# Patient Record
Sex: Male | Born: 1970 | Race: White | Hispanic: No | Marital: Married | Smoking: Current every day smoker
Health system: Southern US, Community
[De-identification: ages and names within clinical notes are randomized; demographics above are authoritative.]

## PROBLEM LIST (undated history)

## (undated) DIAGNOSIS — I10 Essential (primary) hypertension: Secondary | ICD-10-CM

## (undated) DIAGNOSIS — F329 Major depressive disorder, single episode, unspecified: Secondary | ICD-10-CM

## (undated) DIAGNOSIS — F32A Depression, unspecified: Secondary | ICD-10-CM

## (undated) HISTORY — PX: HERNIA REPAIR: SHX51

---

## 2007-02-17 ENCOUNTER — Emergency Department (HOSPITAL_COMMUNITY): Admission: EM | Admit: 2007-02-17 | Discharge: 2007-02-17 | Payer: Self-pay | Admitting: Emergency Medicine

## 2008-02-10 ENCOUNTER — Emergency Department (HOSPITAL_COMMUNITY): Admission: EM | Admit: 2008-02-10 | Discharge: 2008-02-10 | Payer: Self-pay | Admitting: Emergency Medicine

## 2009-05-21 ENCOUNTER — Emergency Department (HOSPITAL_COMMUNITY): Admission: EM | Admit: 2009-05-21 | Discharge: 2009-05-21 | Payer: Self-pay | Admitting: Emergency Medicine

## 2010-05-26 IMAGING — CR DG CHEST 2V
2 series · 2 of 2 positions shown · non-contrast
Comparison: None

CLINICAL DATA: High blood pressure and palpitations

CHEST - 2 VIEW

[view not recorded (1 of 2)]
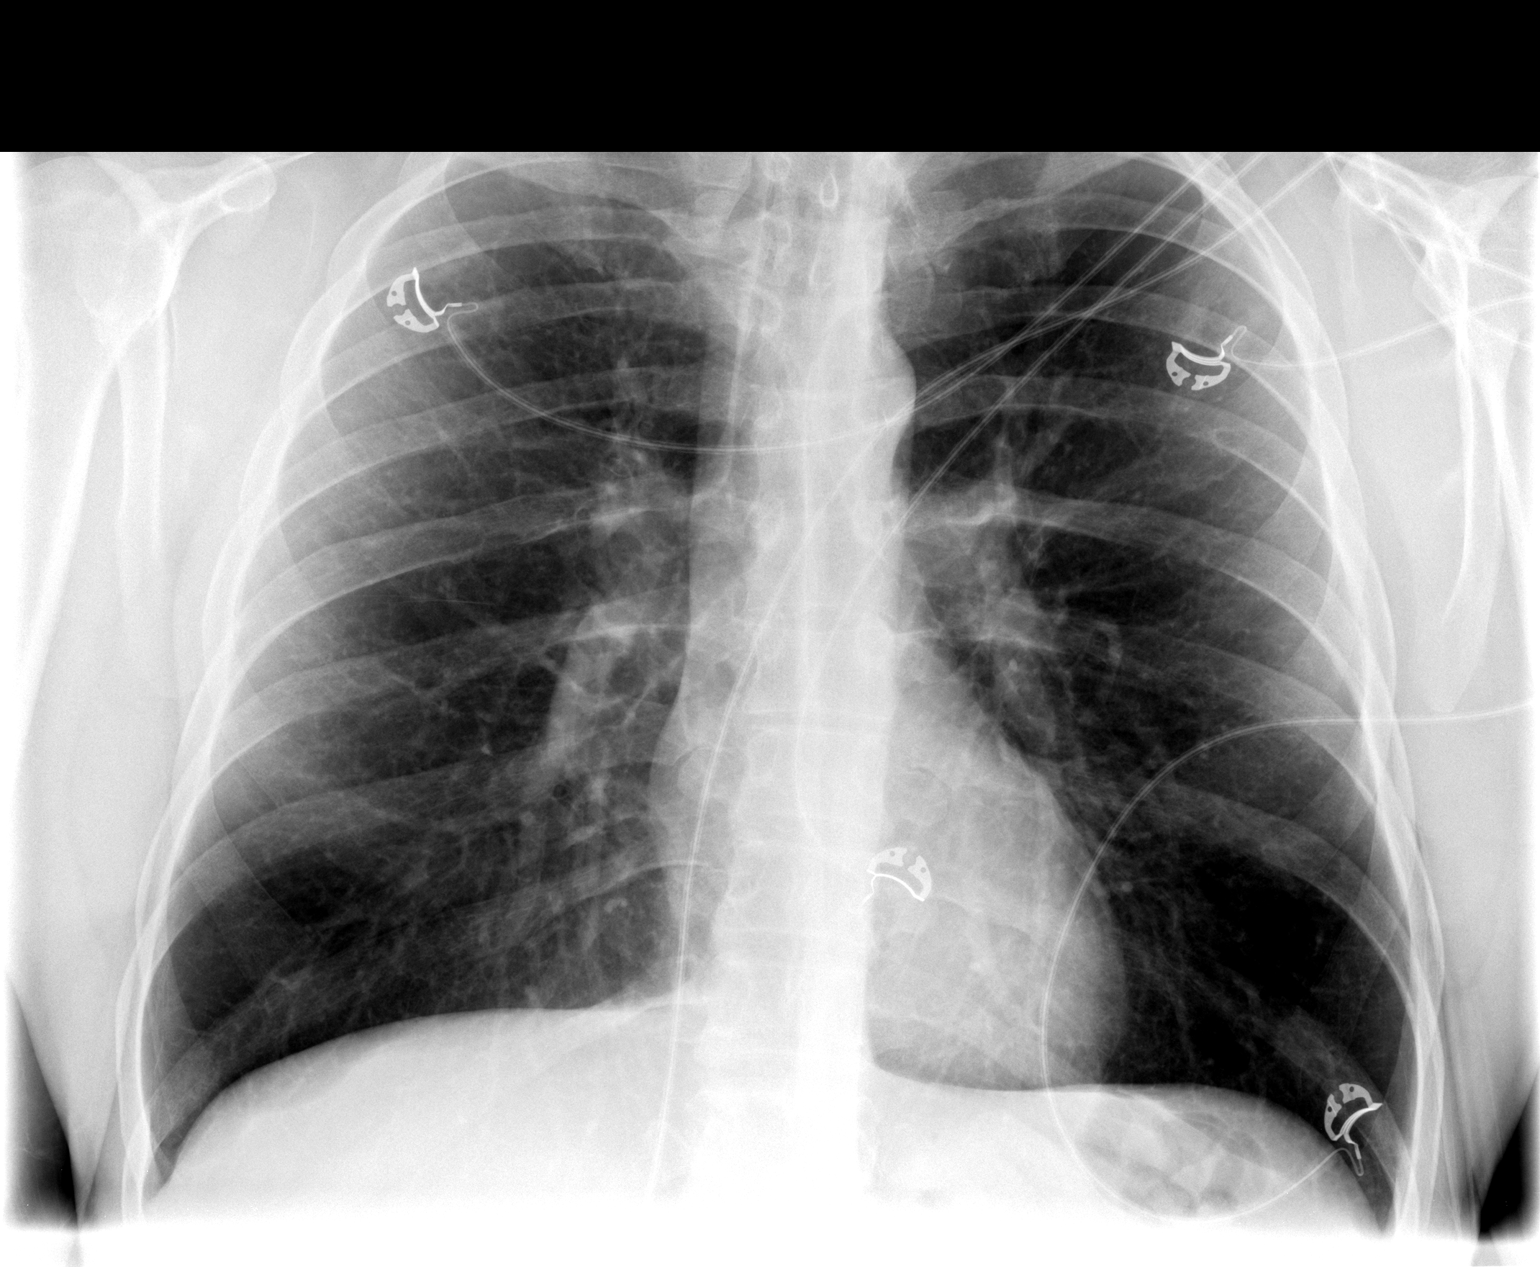

[view not recorded (2 of 2)]
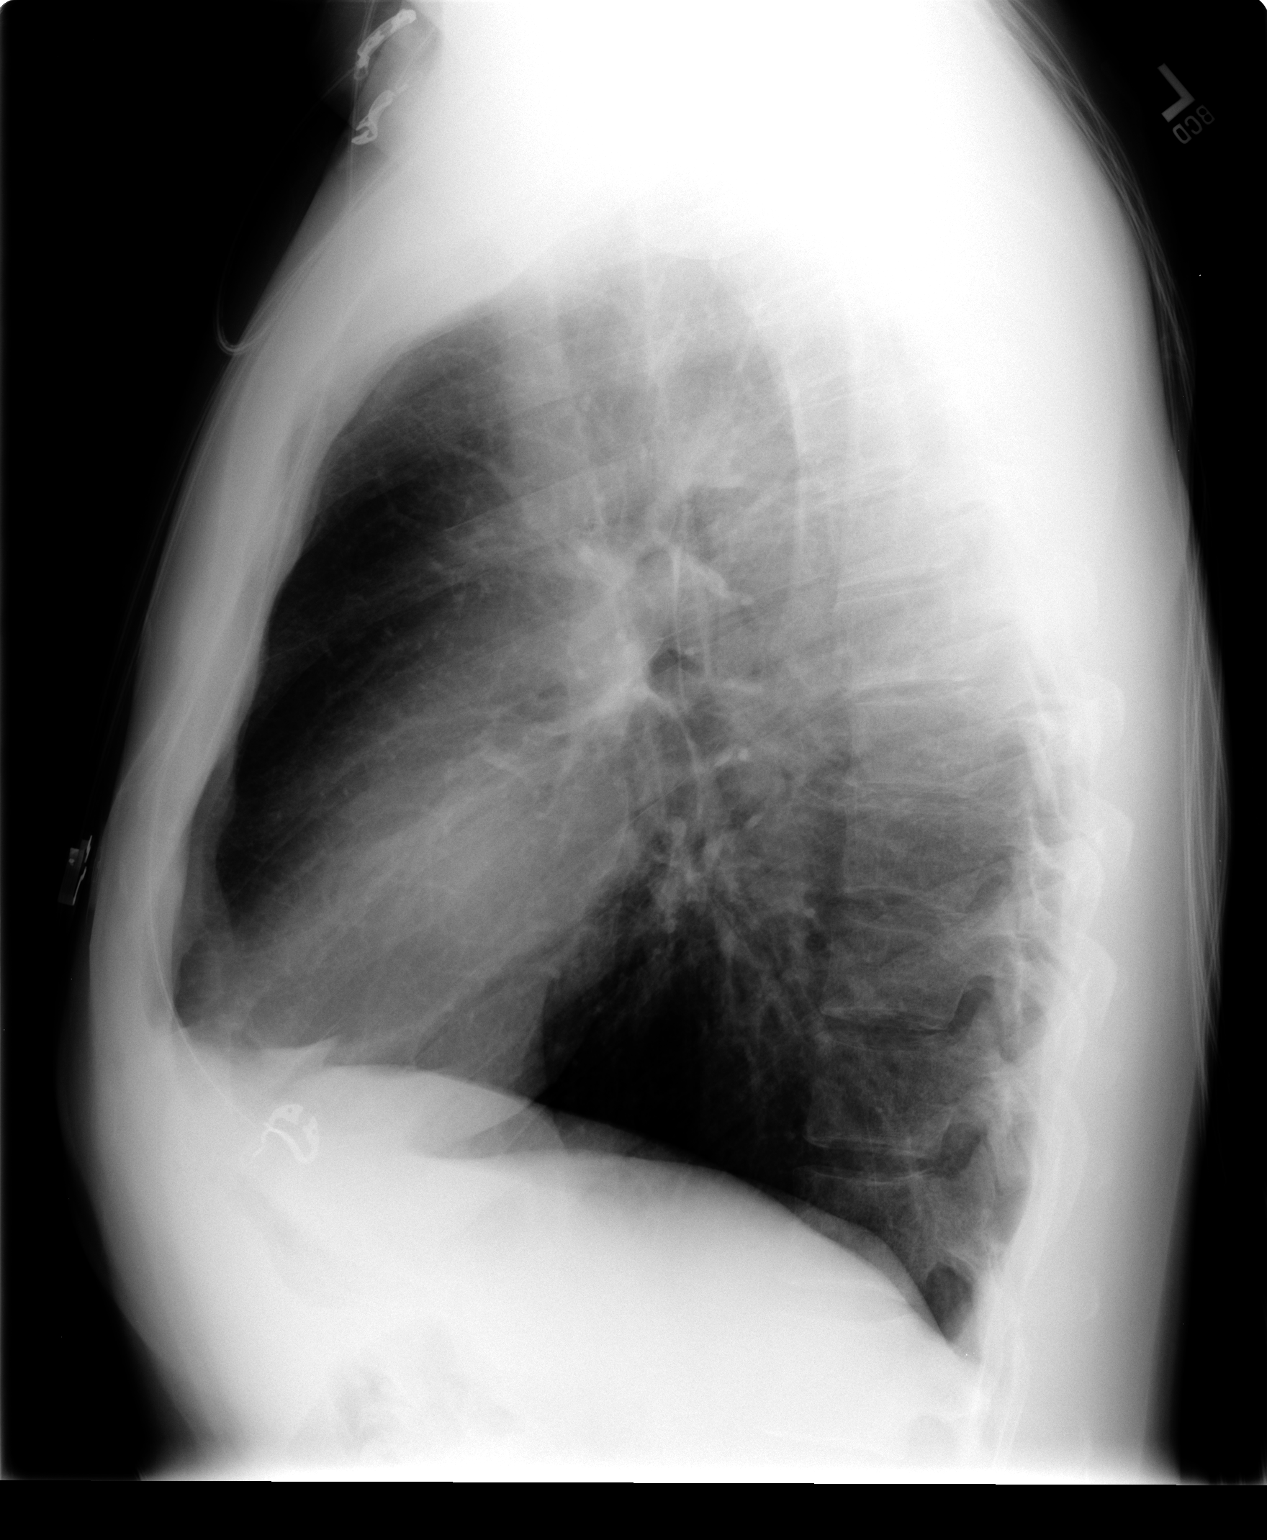

[2 of 2 positions shown; findings below may reference images not displayed]

FINDINGS: Heart and mediastinal contours are normal.  The trachea
is midline.  Lungs are well expanded and clear.  There is no
pleural effusion or pneumothorax.  Pulmonary vascularity is normal.
No acute osseous abnormality identified.
IMPRESSION: No acute cardiopulmonary disease.

## 2010-12-16 LAB — POCT CARDIAC MARKERS
CKMB, poc: <1 ng/mL — ABNORMAL LOW (ref 1.0–8.0)
Myoglobin, poc: 38.7 ng/mL (ref 12–200)
Troponin i, poc: <0.05 ng/mL (ref 0.00–0.09)

## 2010-12-16 LAB — POCT I-STAT, CHEM 8
Chloride: 96 mEq/L (ref 96–112)
Glucose, Bld: 105 mg/dL — ABNORMAL HIGH (ref 70–99)
HCT: 49 % (ref 39.0–52.0)
Hemoglobin: 16.7 g/dL (ref 13.0–17.0)
Potassium: 3.5 mEq/L (ref 3.5–5.1)
Sodium: 130 mEq/L — ABNORMAL LOW (ref 135–145)

## 2016-08-31 ENCOUNTER — Encounter (HOSPITAL_COMMUNITY): Payer: Self-pay | Admitting: Emergency Medicine

## 2016-08-31 ENCOUNTER — Emergency Department (HOSPITAL_COMMUNITY)
Admission: EM | Admit: 2016-08-31 | Discharge: 2016-09-01 | Disposition: A | Discharge status: Other Acute Inpatient Hospital | Payer: Managed Care, Other (non HMO) | Attending: Emergency Medicine | Admitting: Emergency Medicine

## 2016-08-31 DIAGNOSIS — Z79899 Other long term (current) drug therapy: Secondary | ICD-10-CM | POA: Diagnosis not present

## 2016-08-31 DIAGNOSIS — I10 Essential (primary) hypertension: Secondary | ICD-10-CM | POA: Insufficient documentation

## 2016-08-31 DIAGNOSIS — R4182 Altered mental status, unspecified: Secondary | ICD-10-CM

## 2016-08-31 DIAGNOSIS — F29 Unspecified psychosis not due to a substance or known physiological condition: Secondary | ICD-10-CM | POA: Diagnosis not present

## 2016-08-31 DIAGNOSIS — F17210 Nicotine dependence, cigarettes, uncomplicated: Secondary | ICD-10-CM | POA: Insufficient documentation

## 2016-08-31 HISTORY — DX: Major depressive disorder, single episode, unspecified: F32.9

## 2016-08-31 HISTORY — DX: Depression, unspecified: F32.A

## 2016-08-31 HISTORY — DX: Essential (primary) hypertension: I10

## 2016-08-31 LAB — COMPREHENSIVE METABOLIC PANEL
ALT: 61 U/L (ref 17–63)
AST: 103 U/L — ABNORMAL HIGH (ref 15–41)
Albumin: 4.9 g/dL (ref 3.5–5.0)
Alkaline Phosphatase: 62 U/L (ref 38–126)
Anion gap: 15 (ref 5–15)
BILIRUBIN TOTAL: 1.2 mg/dL (ref 0.3–1.2)
BUN: 12 mg/dL (ref 6–20)
CHLORIDE: 94 mmol/L — AB (ref 101–111)
CO2: 23 mmol/L (ref 22–32)
CREATININE: 1.13 mg/dL (ref 0.61–1.24)
Calcium: 9.9 mg/dL (ref 8.9–10.3)
Glucose, Bld: 112 mg/dL — ABNORMAL HIGH (ref 65–99)
Potassium: 3.7 mmol/L (ref 3.5–5.1)
Sodium: 132 mmol/L — ABNORMAL LOW (ref 135–145)
TOTAL PROTEIN: 8 g/dL (ref 6.5–8.1)

## 2016-08-31 LAB — URINALYSIS, ROUTINE W REFLEX MICROSCOPIC
GLUCOSE, UA: NEGATIVE mg/dL
HGB URINE DIPSTICK: NEGATIVE
Ketones, ur: >80 mg/dL — AB
Leukocytes, UA: NEGATIVE
Nitrite: NEGATIVE
PH: 6.5 (ref 5.0–8.0)
Protein, ur: 30 mg/dL — AB
SPECIFIC GRAVITY, URINE: 1.02 (ref 1.005–1.030)

## 2016-08-31 LAB — CBC WITH DIFFERENTIAL/PLATELET
BASOS ABS: 0 10*3/uL (ref 0.0–0.1)
BASOS PCT: 0 %
EOS ABS: 0 10*3/uL (ref 0.0–0.7)
EOS PCT: 0 %
HCT: 41.8 % (ref 39.0–52.0)
Hemoglobin: 14.9 g/dL (ref 13.0–17.0)
LYMPHS PCT: 10 %
Lymphs Abs: 2.3 10*3/uL (ref 0.7–4.0)
MCH: 31.4 pg (ref 26.0–34.0)
MCHC: 35.6 g/dL (ref 30.0–36.0)
MCV: 88.2 fL (ref 78.0–100.0)
MONO ABS: 2.1 10*3/uL — AB (ref 0.1–1.0)
Monocytes Relative: 9 %
Neutro Abs: 19 10*3/uL — ABNORMAL HIGH (ref 1.7–7.7)
Neutrophils Relative %: 81 %
PLATELETS: 286 10*3/uL (ref 150–400)
RBC: 4.74 MIL/uL (ref 4.22–5.81)
RDW: 12.9 % (ref 11.5–15.5)
WBC: 23.4 10*3/uL — AB (ref 4.0–10.5)

## 2016-08-31 LAB — URINALYSIS, MICROSCOPIC (REFLEX)

## 2016-08-31 LAB — RAPID URINE DRUG SCREEN, HOSP PERFORMED
AMPHETAMINES: POSITIVE — AB
BENZODIAZEPINES: NOT DETECTED
Barbiturates: NOT DETECTED
COCAINE: NOT DETECTED
OPIATES: NOT DETECTED
Tetrahydrocannabinol: POSITIVE — AB

## 2016-08-31 LAB — ETHANOL

## 2016-08-31 MED ORDER — ZOLPIDEM TARTRATE 5 MG PO TABS
5.0000 mg | ORAL_TABLET | Freq: Every evening | ORAL | Status: DC | PRN
  Administered 2016-08-31: 5 mg via ORAL
  Filled 2016-08-31: qty 1

## 2016-08-31 MED ORDER — IBUPROFEN 400 MG PO TABS: 600.0000 mg | ORAL_TABLET | Freq: Three times a day (TID) | ORAL | Status: DC | PRN

## 2016-08-31 MED ORDER — ONDANSETRON HCL 4 MG PO TABS: 4.0000 mg | ORAL_TABLET | Freq: Three times a day (TID) | ORAL | Status: DC | PRN

## 2016-08-31 MED ORDER — ALUM & MAG HYDROXIDE-SIMETH 200-200-20 MG/5ML PO SUSP: 30.0000 mL | ORAL | Status: DC | PRN

## 2016-08-31 MED ORDER — ACETAMINOPHEN 325 MG PO TABS: 650.0000 mg | ORAL_TABLET | ORAL | Status: DC | PRN

## 2016-08-31 MED ORDER — NICOTINE 21 MG/24HR TD PT24
21.0000 mg | MEDICATED_PATCH | Freq: Every day | TRANSDERMAL | Status: DC | PRN
  Administered 2016-08-31: 21 mg via TRANSDERMAL
  Filled 2016-08-31: qty 1

## 2016-08-31 NOTE — ED Notes (Signed)
PT undergoing TTS at this time.

## 2016-08-31 NOTE — BH Assessment (Signed)
Tele Assessment Note   Gary Oconnor is an 45 y.o. male. Pt denies SI/HI. Pt reports paranoia. Pt states he feels someone is following him. Pt states he sees clocks with the numbers backwards. Pt states he heard a voice from the clock tell him to hurt others. Pt denies previous AVH. Pt reports daily marijuana use. Pt denies alcohol use. Pt denies current or previous outpatient treatment. Pt denies current or previous inpatient treatment. Pt reports stress from needing to focus more at work. Per Pt he took 2 Adderall recently in order to focus better and they made him feel "worse." Pt denies abuse.   Writer consulted with Renata Capriceonrad, DNP. Per Renata Capriceonrad, DNP Pt meets inpatient treatment criteria. TTS to seek placement.  Diagnosis:  F20.9 Psychosis  Past Medical History:  Past Medical History:  Diagnosis Date  . Depression   . Hypertension     Past Surgical History:  Procedure Laterality Date  . HERNIA REPAIR      Family History: History reviewed. No pertinent family history.  Social History:  reports that he has been smoking Cigarettes.  He has been smoking about 1.00 pack per day. He has never used smokeless tobacco. He reports that he uses drugs, including Marijuana. He reports that he does not drink alcohol.  Additional Social History:  Alcohol / Drug Use Pain Medications: Pt denies Prescriptions: Pt denies Over the Counter: Pt denies History of alcohol / drug use?: Yes Longest period of sobriety (when/how long): unknown Substance #1 Name of Substance 1: Marijuna 1 - Age of First Use: NA 1 - Amount (size/oz): bowl or 2 1 - Frequency: daily 1 - Duration: ongoing 1 - Last Use / Amount: 08/30/16  CIWA: CIWA-Ar BP: 179/100 Pulse Rate: 87 COWS:    PATIENT STRENGTHS: (choose at least two) Average or above average intelligence Communication skills  Allergies: No Known Allergies  Home Medications:  (Not in a hospital admission)  OB/GYN Status:  No LMP for male  patient.  General Assessment Data Location of Assessment: [facility] TTS Assessment: In system Is this a Tele or Face-to-Face Assessment?: Tele Assessment Is this an Initial Assessment or a Re-assessment for this encounter?: Initial Assessment Marital status: Married [place] name: NA Is patient pregnant?: No Pregnancy Status: No Living Arrangements: Spouse/significant other Can pt return to current living arrangement?: Yes Admission Status: Voluntary Is patient capable of signing voluntary admission?: Yes Referral Source: Self/Family/Friend Insurance type: Medical sales representativeCigna     Crisis Care Plan Living Arrangements: Spouse/significant other Legal Guardian: Other: (self) Name of Psychiatrist: NA Name of Therapist: NA  Education Status Is patient currently in school?: No Current Grade: NA Highest grade of school patient has completed: some college Name of school: NA Contact person: NA  Risk to self with the past 6 months Suicidal Ideation: No Has patient been a risk to self within the past 6 months prior to admission? : No Suicidal Intent: No Has patient had any suicidal intent within the past 6 months prior to admission? : No Is patient at risk for suicide?: No Suicidal Plan?: No Has patient had any suicidal plan within the past 6 months prior to admission? : No Access to Means: No What has been your use of drugs/alcohol within the last 12 months?: marijuana Previous Attempts/Gestures: No How many times?: 0 Other Self Harm Risks: NA Triggers for Past Attempts: None known Intentional Self Injurious Behavior: None Family Suicide History: No Recent stressful life event(s): Other (Comment) (NA) Persecutory voices/beliefs?: No Depression: No Depression  Symptoms:  (Pt denies) Substance abuse history and/or treatment for substance abuse?: No Suicide prevention information given to non-admitted patients: Not applicable  Risk to Others within the past 6 months Homicidal Ideation: No Does  patient have any lifetime risk of violence toward others beyond the six months prior to admission? : No Thoughts of Harm to Others: No Current Homicidal Intent: No Current Homicidal Plan: No Access to Homicidal Means: No Identified Victim: NA History of harm to others?: No Assessment of Violence: None Noted Violent Behavior Description: NA Does patient have access to weapons?: No Criminal Charges Pending?: No Does patient have a court date: No Is patient on probation?: No  Psychosis Hallucinations: None noted Delusions: None noted  Mental Status Report Appearance/Hygiene: Unremarkable, In hospital gown Eye Contact: Fair Motor Activity: Freedom of movement Speech: Logical/coherent Level of Consciousness: Alert Mood: Anxious Affect: Anxious Anxiety Level: Moderate Thought Processes: Relevant, Coherent Judgement: Impaired Orientation: Person, Place, Time, Situation Obsessive Compulsive Thoughts/Behaviors: None  Cognitive Functioning Concentration: Normal Memory: Recent Intact, Remote Intact IQ: Average Insight: Fair Impulse Control: Fair Appetite: Poor Weight Loss: 0 Weight Gain: 0 Sleep: Decreased Total Hours of Sleep: 0 Vegetative Symptoms: None  ADLScreening [facility] Assessment Services) Patient's cognitive ability adequate to safely complete daily activities?: Yes Patient able to express need for assistance with ADLs?: Yes Independently performs ADLs?: Yes (appropriate for developmental age)  Prior Inpatient Therapy Prior Inpatient Therapy: No Prior Therapy Dates: NA Prior Therapy Facilty/Provider(s): NA Reason for Treatment: NA  Prior Outpatient Therapy Prior Outpatient Therapy: No Prior Therapy Dates: NA Prior Therapy Facilty/Provider(s): NA Reason for Treatment: NA Does patient have an ACCT team?: No Does patient have Intensive In-House Services?  : No Does patient have [facility] services? : No Does patient have P4CC services?: No  ADL Screening  (condition at time of admission) Patient's cognitive ability adequate to safely complete daily activities?: Yes Is the patient deaf or have difficulty hearing?: No Does the patient have difficulty seeing, even when wearing glasses/contacts?: No Does the patient have difficulty concentrating, remembering, or making decisions?: No Patient able to express need for assistance with ADLs?: Yes Does the patient have difficulty dressing or bathing?: No Independently performs ADLs?: Yes (appropriate for developmental age) Does the patient have difficulty walking or climbing stairs?: No Weakness of Legs: None Weakness of Arms/Hands: None       Abuse/Neglect Assessment (Assessment to be complete while patient is alone) Physical Abuse: Denies Verbal Abuse: Denies Sexual Abuse: Denies Exploitation of patient/patient's resources: Denies Self-Neglect: Denies     Merchant navy officerAdvance Directives (For Healthcare) Does Patient Have a Medical Advance Directive?: No Would patient like information on creating a medical advance directive?: No - Patient declined    Additional Information 1:1 In Past 12 Months?: No CIRT Risk: No Elopement Risk: No Does patient have medical clearance?: Yes     Disposition:  Disposition Initial Assessment Completed for this Encounter: Yes Disposition of Patient: Inpatient treatment program Type of inpatient treatment program: Adult  Gary Oconnor,Gary Oconnor 08/31/2016 5:49 PM

## 2016-08-31 NOTE — ED Provider Notes (Signed)
[facility] Provider Note   CSN: [id number] Arrival date & time: 08/31/16  1511     History   Chief Complaint Chief Complaint  Patient presents with  . V70.1    HPI Gary Oconnor is a 45 y.o. male.  He presents with family members for concern of abnormal thoughts. Patient states that when he sees numbers or words. He sometimes mixes up the order of the characters, and this results in a different meaning of the items. Also, he sometimes hears people talk, but understands what they say, as different from what they are actually saying. Yesterday, he thought someone was going to murder him but he did not know whom. Because of this, he left work early, drove around a bit, then parked his car and stayed in his car all night. He was very cold during this experience. Because of the cold December night. He states that he put insulation in his shoes, to keep him warmer. Today, his brother, who is a Hydrographic surveyorlaw enforcement officer, and his wife, encouraged him to come here for evaluation. The patient denies homicidal or suicidal ideation. He admits to using Adderall twice, "over the weekend," in order to "see if I could work better". He didn't like how it made him feel so he threw them away. He brought these medications illicitly. He smokes marijuana multiple times each day, but denies use of other illegal drugs. He does not drink alcohol heavily. He spends a lot of time, watching YouTube videos about various gods, constellations, and stars. He feels like "things are rotating around the sun, but I don't know why". He takes Prozac, for depression. He states he usually takes it. His work attendance has been good. He is getting along well with his wife other than the fact that he spends a lot of time on the Internet and wishes he spent more "quality time" with her. He denies being in any legal trouble. He states that he feels "stressed", but is not sure why. He denies recent illnesses, including fever, chills,  chest pain, weakness or dizziness. He has not eaten anything for the last day, and is currently hungry. There are no other known modifying factors.  HPI  Past Medical History:  Diagnosis Date  . Depression   . Hypertension     There are no active problems to display for this patient.   Past Surgical History:  Procedure Laterality Date  . HERNIA REPAIR         Home Medications    Prior to Admission medications   Medication Sig Start Date End Date Taking? Authorizing Provider  atenolol (TENORMIN) 25 MG tablet Take by mouth daily.   Yes Historical Provider, MD  FLUoxetine (PROZAC) 20 MG capsule Take 20 mg by mouth daily.   Yes Historical Provider, MD  lisinopril-hydrochlorothiazide (PRINZIDE,ZESTORETIC) 10-12.5 MG tablet Take 1 tablet by mouth daily.   Yes Historical Provider, MD    Family History History reviewed. No pertinent family history.  Social History Social History  Substance Use Topics  . Smoking status: Current Every Day Smoker    Packs/day: 1.00    Types: Cigarettes  . Smokeless tobacco: Never Used  . Alcohol use No     Allergies   Patient has no known allergies.   Review of Systems Review of Systems  All other systems reviewed and are negative.    Physical Exam Updated Vital Signs BP 156/97 (BP Location: Left Arm)   Pulse 85   Temp 99.6 F (37.6  C) (Oral)   Resp 18   Ht 6' (1.829 m)   Wt 160 lb 12.8 oz (72.9 kg)   SpO2 97%   BMI 21.81 kg/m   Physical Exam  Constitutional: He is oriented to person, place, and time. He appears well-developed and well-nourished.  HENT:  Head: Normocephalic and atraumatic.  Right Ear: External ear normal.  Left Ear: External ear normal.  Eyes: Conjunctivae and EOM are normal. Pupils are equal, round, and reactive to light.  Neck: Normal range of motion and phonation normal. Neck supple.  Cardiovascular: Normal rate.   Pulmonary/Chest: Effort normal. He exhibits no bony tenderness.  Abdominal: He  exhibits no distension.  Musculoskeletal: Normal range of motion.  Neurological: He is alert and oriented to person, place, and time. No cranial nerve deficit or sensory deficit. He exhibits normal muscle tone. Coordination normal.  Skin: Skin is warm, dry and intact.  Psychiatric: He has a normal mood and affect. His behavior is normal.  Cooperative and comfortable. He does not appear to be responding to internal stimuli.  Nursing note and vitals reviewed.    ED Treatments / Results  Labs (all labs ordered are listed, but only abnormal results are displayed) Labs Reviewed  COMPREHENSIVE METABOLIC PANEL - Abnormal; Notable for the following:       Result Value   Sodium 132 (*)    Chloride 94 (*)    Glucose, Bld 112 (*)    AST 103 (*)    All other components within normal limits  CBC WITH DIFFERENTIAL/PLATELET - Abnormal; Notable for the following:    WBC 23.4 (*)    Neutro Abs 19.0 (*)    Monocytes Absolute 2.1 (*)    All other components within normal limits  RAPID URINE DRUG SCREEN, HOSP PERFORMED - Abnormal; Notable for the following:    Amphetamines POSITIVE (*)    Tetrahydrocannabinol POSITIVE (*)    All other components within normal limits  URINALYSIS, ROUTINE W REFLEX MICROSCOPIC - Abnormal; Notable for the following:    Bilirubin Urine MODERATE (*)    Ketones, ur >80 (*)    Protein, ur 30 (*)    All other components within normal limits  URINALYSIS, MICROSCOPIC (REFLEX) - Abnormal; Notable for the following:    Bacteria, UA MANY (*)    Squamous Epithelial / LPF 0-5 (*)    All other components within normal limits  ETHANOL    EKG  EKG Interpretation None       Radiology No results found.  Procedures Procedures (including critical care time)  Medications Ordered in ED Medications  acetaminophen (TYLENOL) tablet 650 mg (not administered)  ibuprofen (ADVIL,MOTRIN) tablet 600 mg (not administered)  zolpidem (AMBIEN) tablet 5 mg (5 mg Oral Given 08/31/16  2136)  nicotine (NICODERM CQ - dosed in mg/24 hours) patch 21 mg (21 mg Transdermal Patch Applied 08/31/16 1914)  ondansetron (ZOFRAN) tablet 4 mg (not administered)  alum & mag hydroxide-simeth (MAALOX/MYLANTA) 200-200-20 MG/5ML suspension 30 mL (not administered)  atenolol (TENORMIN) tablet 25 mg (not administered)  FLUoxetine (PROZAC) capsule 20 mg (not administered)  lisinopril-hydrochlorothiazide (PRINZIDE,ZESTORETIC) 10-12.5 MG per tablet 1 tablet (not administered)     Initial Impression / Assessment and Plan / ED Course  I have reviewed the triage vital signs and the nursing notes.  Pertinent labs & imaging results that were available during my care of the patient were reviewed by me and considered in my medical decision making (see chart for details).  Clinical Course as of  Sep 01 4  Thu Aug 31, 2016  1658 High, nonspecific in light of absence of clinical indicator, for infection. It is likely high because he spent the night outside last night, causing physiologic stress. WBC: (!) 23.4 [EW]  1659 Normal Alcohol, Ethyl (B): <5 [EW]  1659 Mildly low, likely related to decreased oral intake in the last 24 hours. Sodium: (!) 132 [EW]  1659 Nonspecific elevation with other markers of liver dysfunction, being normal. AST: (!) 103 [EW]  1700 Consistent with stated use of Adderall Amphetamines: (!) POSITIVE [EW]  1700 Consistent with known drug abuse. Tetrahydrocannabinol: (!) POSITIVE [EW]  Fri Sep 01, 2016  0002 The patient is medically cleared for treatment by psychiatry services.   He has been seen by TTS, and they are working on placement.  [EW]    Clinical Course User Index [EW] Mancel Bale, MD    Medications  acetaminophen (TYLENOL) tablet 650 mg (not administered)  ibuprofen (ADVIL,MOTRIN) tablet 600 mg (not administered)  zolpidem (AMBIEN) tablet 5 mg (5 mg Oral Given 08/31/16 2136)  nicotine (NICODERM CQ - dosed in mg/24 hours) patch 21 mg (21 mg Transdermal Patch  Applied 08/31/16 1914)  ondansetron (ZOFRAN) tablet 4 mg (not administered)  alum & mag hydroxide-simeth (MAALOX/MYLANTA) 200-200-20 MG/5ML suspension 30 mL (not administered)  atenolol (TENORMIN) tablet 25 mg (not administered)  FLUoxetine (PROZAC) capsule 20 mg (not administered)  lisinopril-hydrochlorothiazide (PRINZIDE,ZESTORETIC) 10-12.5 MG per tablet 1 tablet (not administered)    Patient Vitals for the past 24 hrs:  BP Temp Temp src Pulse Resp SpO2 Height Weight  08/31/16 2139 156/97 - - 85 18 97 % - -  08/31/16 1519 - - - - - - 6' (1.829 m) 160 lb 12.8 oz (72.9 kg)  08/31/16 1518 179/100 99.6 F (37.6 C) Oral 87 20 99 % - -   TTS consult   Final Clinical Impressions(s) / ED Diagnoses   Final diagnoses:  Altered mental status, unspecified altered mental status type   Paranoia, with confusion, and reality dissociation. No suicidal or homicidal ideation.  Nursing Notes Reviewed/ Care Coordinated Applicable Imaging Reviewed Interpretation of Laboratory Data incorporated into ED treatment  Plan: Admit to Psych  New Prescriptions New Prescriptions   No medications on file     Mancel Bale, MD 09/01/16 0005

## 2016-08-31 NOTE — [facility] Counselor (Addendum)
Clinician received a [facility] from [place] [facility]. Per Gearldine BienenstockBrandy [facility], attending physician: Dr. Binnie Railhotukura. Nursing report: [phone]. Per Gearldine BienenstockBrandy pt can come any time after 0900. Updated disposition was discussed with Darl PikesSusan, RN.

## 2016-08-31 NOTE — Progress Notes (Signed)
This Clinical research associatewriter attempted to secure placement at the following facilites:   Emergency planning/management officerBrynn [facility]   Maryelizabeth Rowanressa Giorgi Debruin, MSW, Tinnie GensLCSW, LCAS, CCSI [facility] Triage Specialist [phone]

## 2016-08-31 NOTE — ED Notes (Signed)
Pt given meal tray.

## 2016-08-31 NOTE — ED Notes (Signed)
Patient's girlfriend states she is leaving soon and wants to be called when patient is placed somewhere, no matter what time, "even in the middle of the night." Patient verbalized understanding. Name is Gary Oconnor, number is [phone].

## 2016-08-31 NOTE — [facility] Counselor (Signed)
Clinician received a [facility] [place], clinician gave Gearldine BienenstockBrandy, [facility]'s number to answer her medical questions on the pt.   Gwinda Passereylese D Bennett, MS, [facility], [facility] Triage Specialist [phone]

## 2016-08-31 NOTE — ED Triage Notes (Signed)
Pt states he has been paranoid for the past few days. Pt denies hx of same. Per police reports pt thinks he can see symbols in numbers, can hear people speak but can also hear what they mean.

## 2016-08-31 NOTE — ED Notes (Signed)
No sitter orders placed at this time. MD states patient does not need a sitter. NAD noted.    Pt continues to deny SI/HI

## 2016-08-31 NOTE — ED Notes (Signed)
[facility] called and states they are seeking placement for this patient. They also state that patient doesn't need a sitter at this time.

## 2016-09-01 MED ORDER — FLUOXETINE HCL 20 MG PO CAPS
20.0000 mg | ORAL_CAPSULE | Freq: Every day | ORAL | Status: DC
  Administered 2016-09-01: 20 mg via ORAL
  Filled 2016-09-01: qty 1

## 2016-09-01 MED ORDER — LISINOPRIL 10 MG PO TABS
10.0000 mg | ORAL_TABLET | Freq: Every day | ORAL | Status: DC
  Administered 2016-09-01: 10 mg via ORAL
  Filled 2016-09-01: qty 1

## 2016-09-01 MED ORDER — LISINOPRIL-HYDROCHLOROTHIAZIDE 10-12.5 MG PO TABS: 1.0000 | ORAL_TABLET | Freq: Every day | ORAL | Status: DC

## 2016-09-01 MED ORDER — HYDROCHLOROTHIAZIDE 12.5 MG PO CAPS
12.5000 mg | ORAL_CAPSULE | Freq: Every day | ORAL | Status: DC
  Administered 2016-09-01: 12.5 mg via ORAL
  Filled 2016-09-01: qty 1

## 2016-09-01 MED ORDER — ATENOLOL 25 MG PO TABS
25.0000 mg | ORAL_TABLET | Freq: Every day | ORAL | Status: DC
  Administered 2016-09-01: 25 mg via ORAL
  Filled 2016-09-01: qty 1

## 2016-09-01 NOTE — ED Provider Notes (Signed)
  Physical Exam  BP 150/90   Pulse 84   Temp 98.1 F (36.7 C) (Oral)   Resp 20   Ht 6' (1.829 m)   Wt 160 lb 12.8 oz (72.9 kg)   SpO2 98%   BMI 21.81 kg/m   Physical Exam  ED Course  Procedures  [facility] by Dr Horald Chestnuthotukura       Lekha Dancer, MD 09/01/16 [phone]

## 2016-09-01 NOTE — ED Notes (Signed)
[facility] Transport can pick him up around 0830 [facility]. Accepting physician: Dr. Binnie Railhotukura. Call report to: [phone]. [facility]

## 2016-09-01 NOTE — ED Notes (Signed)
Report given to Cecelia ByarsJen Martin RN [facility].

## 2016-09-01 NOTE — ED Notes (Signed)
Pt very polite and cooperative.  Informed [facility] was [facility] and given breakfast tray.
